# Patient Record
Sex: Male | Born: 2000 | Hispanic: Yes | Marital: Single | Smoking: Never smoker
Health system: Southern US, Community
[De-identification: ages and names within clinical notes are randomized; demographics above are authoritative.]

---
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
## 2023-04-12 IMAGING — DX DG CHEST 1V PORT
1 series · 1 of 1 positions shown · non-contrast
Comparison: None.

CLINICAL DATA: Shortness of breath.  COVID positive.

EXAM:
PORTABLE CHEST 1 VIEW

[chest]
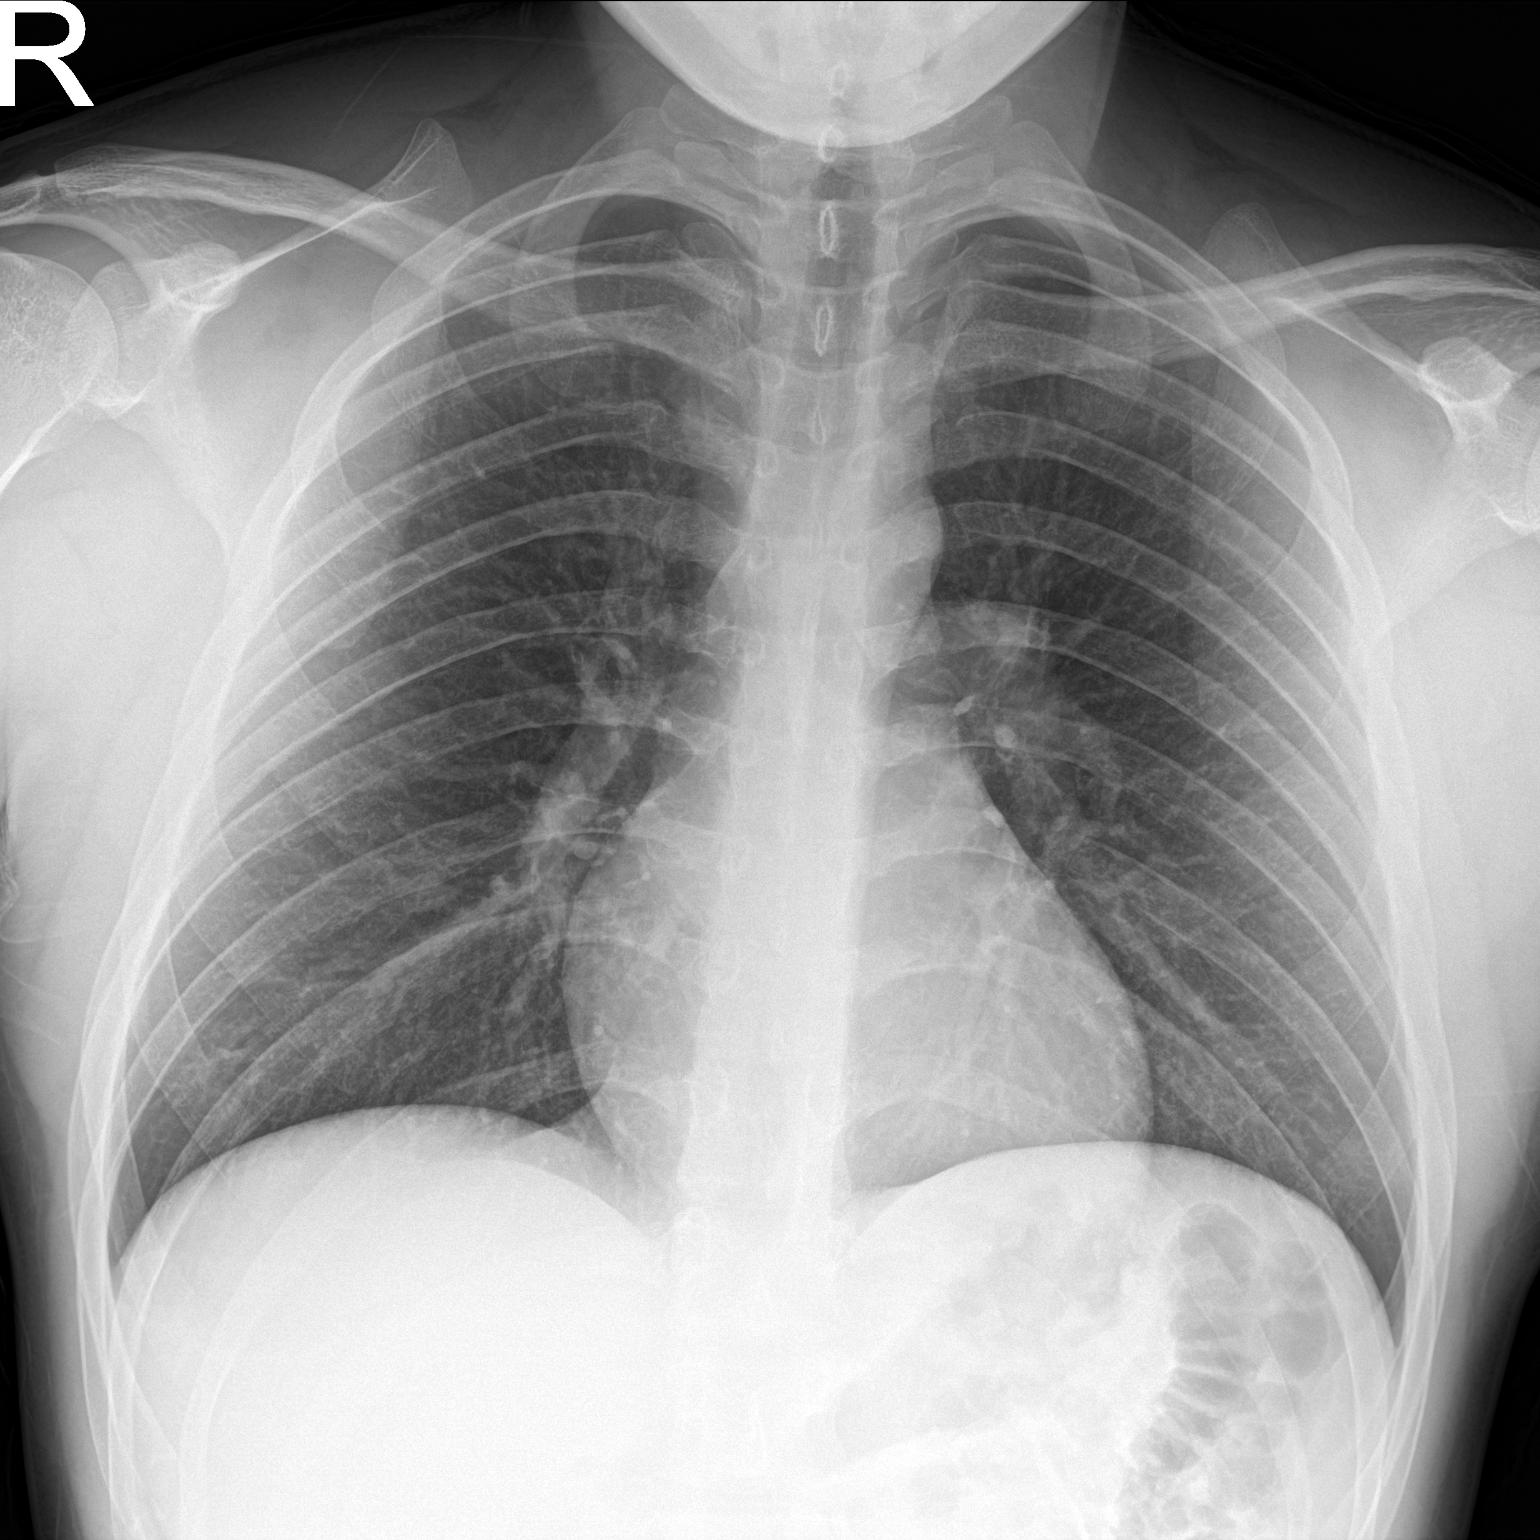

[1 of 1 positions shown; findings below may reference images not displayed]

FINDINGS: The cardiomediastinal contours are normal. Minimal vague peripheral
opacity at the right lung base. Pulmonary vasculature is normal. No
consolidation, pleural effusion, or pneumothorax. No acute osseous
abnormalities are seen.
IMPRESSION: Minimal vague peripheral opacity at the right lung base may
represent COVID pneumonia.
